# Patient Record
Sex: Female | Born: 1945 | Race: White | Hispanic: No
Health system: Southern US, Community
[De-identification: ages and names within clinical notes are randomized; demographics above are authoritative.]

---

## 1998-01-31 ENCOUNTER — Encounter
Admission: RE | Admit: 1998-01-31 | Discharge: 1998-05-01 | Payer: Self-pay | Admitting: Physical Medicine & Rehabilitation

## 2005-10-07 ENCOUNTER — Ambulatory Visit: Payer: Self-pay | Admitting: Family Medicine

## 2005-12-17 ENCOUNTER — Encounter: Payer: Self-pay | Admitting: Family Medicine

## 2005-12-23 ENCOUNTER — Encounter: Payer: Self-pay | Admitting: Family Medicine

## 2006-01-23 ENCOUNTER — Encounter: Payer: Self-pay | Admitting: Family Medicine

## 2006-05-11 ENCOUNTER — Ambulatory Visit: Payer: Self-pay | Admitting: Unknown Physician Specialty

## 2011-05-19 ENCOUNTER — Ambulatory Visit: Payer: Self-pay | Admitting: Family Medicine

## 2012-05-23 ENCOUNTER — Ambulatory Visit: Payer: Self-pay | Admitting: Family Medicine

## 2012-10-31 ENCOUNTER — Emergency Department: Payer: Self-pay | Admitting: Emergency Medicine

## 2012-10-31 LAB — COMPREHENSIVE METABOLIC PANEL
Albumin: 4.1 g/dL (ref 3.4–5.0)
Anion Gap: 10 (ref 7–16)
Bilirubin,Total: 0.5 mg/dL (ref 0.2–1.0)
Calcium, Total: 9.5 mg/dL (ref 8.5–10.1)
Chloride: 105 mmol/L (ref 98–107)
Co2: 25 mmol/L (ref 21–32)
Creatinine: 0.95 mg/dL (ref 0.60–1.30)
EGFR (African American): >60
Glucose: 101 mg/dL — ABNORMAL HIGH (ref 65–99)
Osmolality: 279 (ref 275–301)
Potassium: 3.4 mmol/L — ABNORMAL LOW (ref 3.5–5.1)

## 2012-10-31 LAB — URINALYSIS, COMPLETE
Bilirubin,UR: NEGATIVE
Blood: NEGATIVE
Glucose,UR: NEGATIVE mg/dL (ref 0–75)
Leukocyte Esterase: NEGATIVE
Nitrite: NEGATIVE
Ph: 6 (ref 4.5–8.0)
Protein: NEGATIVE
Specific Gravity: 1.002 (ref 1.003–1.030)
WBC UR: 1 /HPF (ref 0–5)

## 2012-10-31 LAB — CBC
HGB: 13.6 g/dL (ref 12.0–16.0)
Platelet: 244 10*3/uL (ref 150–440)
RBC: 4.58 10*6/uL (ref 3.80–5.20)
RDW: 13.8 % (ref 11.5–14.5)
WBC: 6 10*3/uL (ref 3.6–11.0)

## 2012-10-31 LAB — LIPASE, BLOOD: Lipase: 105 U/L (ref 73–393)

## 2012-10-31 LAB — CK TOTAL AND CKMB (NOT AT ARMC): CK-MB: 1.6 ng/mL (ref 0.5–3.6)

## 2012-11-02 ENCOUNTER — Inpatient Hospital Stay: Payer: Self-pay | Admitting: Specialist

## 2012-11-02 LAB — CBC
HCT: 38.9 % (ref 35.0–47.0)
HGB: 13.3 g/dL (ref 12.0–16.0)
RBC: 4.57 10*6/uL (ref 3.80–5.20)
RDW: 13.9 % (ref 11.5–14.5)
WBC: 6 10*3/uL (ref 3.6–11.0)

## 2012-11-02 LAB — URINALYSIS, COMPLETE
Bilirubin,UR: NEGATIVE
Nitrite: NEGATIVE
Ph: 6 (ref 4.5–8.0)
Protein: NEGATIVE
RBC,UR: <1 /HPF (ref 0–5)
Squamous Epithelial: NONE SEEN

## 2012-11-02 LAB — COMPREHENSIVE METABOLIC PANEL
Albumin: 3.8 g/dL (ref 3.4–5.0)
Alkaline Phosphatase: 71 U/L (ref 50–136)
Anion Gap: 6 — ABNORMAL LOW (ref 7–16)
BUN: 12 mg/dL (ref 7–18)
Bilirubin,Total: 0.4 mg/dL (ref 0.2–1.0)
Chloride: 105 mmol/L (ref 98–107)
Co2: 28 mmol/L (ref 21–32)
Creatinine: 1.01 mg/dL (ref 0.60–1.30)
EGFR (Non-African Amer.): 58 — ABNORMAL LOW
Potassium: 3.3 mmol/L — ABNORMAL LOW (ref 3.5–5.1)
SGOT(AST): 19 U/L (ref 15–37)
SGPT (ALT): 22 U/L (ref 12–78)

## 2012-11-02 LAB — PROTIME-INR: INR: 1

## 2012-11-03 DIAGNOSIS — I6789 Other cerebrovascular disease: Secondary | ICD-10-CM

## 2012-11-03 LAB — CBC WITH DIFFERENTIAL/PLATELET
Basophil #: 0 10*3/uL (ref 0.0–0.1)
Basophil %: 0.7 %
Eosinophil %: 1.5 %
HGB: 12.8 g/dL (ref 12.0–16.0)
Lymphocyte %: 24 %
MCH: 29.6 pg (ref 26.0–34.0)
MCHC: 34.5 g/dL (ref 32.0–36.0)
MCV: 86 fL (ref 80–100)
Monocyte #: 0.5 x10 3/mm (ref 0.2–0.9)
Neutrophil #: 3.5 10*3/uL (ref 1.4–6.5)
Neutrophil %: 64.6 %
RBC: 4.34 10*6/uL (ref 3.80–5.20)
WBC: 5.4 10*3/uL (ref 3.6–11.0)

## 2012-11-03 LAB — BASIC METABOLIC PANEL
Calcium, Total: 8.9 mg/dL (ref 8.5–10.1)
Co2: 26 mmol/L (ref 21–32)
Creatinine: 0.99 mg/dL (ref 0.60–1.30)
EGFR (Non-African Amer.): 59 — ABNORMAL LOW
Glucose: 99 mg/dL (ref 65–99)
Osmolality: 284 (ref 275–301)
Potassium: 3.9 mmol/L (ref 3.5–5.1)
Sodium: 142 mmol/L (ref 136–145)

## 2012-11-03 LAB — LIPID PANEL
Cholesterol: 147 mg/dL (ref 0–200)
HDL Cholesterol: 37 mg/dL — ABNORMAL LOW (ref 40–60)
Triglycerides: 64 mg/dL (ref 0–200)

## 2012-11-03 LAB — HEMOGLOBIN A1C: Hemoglobin A1C: 5.3 % (ref 4.2–6.3)

## 2012-11-03 LAB — MAGNESIUM: Magnesium: 2 mg/dL

## 2012-11-04 ENCOUNTER — Ambulatory Visit: Payer: Self-pay | Admitting: Neurology

## 2012-11-08 ENCOUNTER — Encounter: Payer: Self-pay | Admitting: Internal Medicine

## 2012-11-25 ENCOUNTER — Encounter: Payer: Self-pay | Admitting: Internal Medicine

## 2013-06-14 ENCOUNTER — Ambulatory Visit: Payer: Self-pay | Admitting: Family Medicine

## 2013-10-01 IMAGING — CT CT HEAD WITHOUT CONTRAST
1 series · 16 of 30 positions shown, 20 images · non-contrast
Comparison: none

REASON FOR EXAM: 4 days left up and low ext weakness
COMMENTS:

[Series 2: soft tissue · axial · 0.42mm/px · z∈[+1178,+1314]mm · 16 of 30 slices shown, 20 images]
[im 2/30  brain]
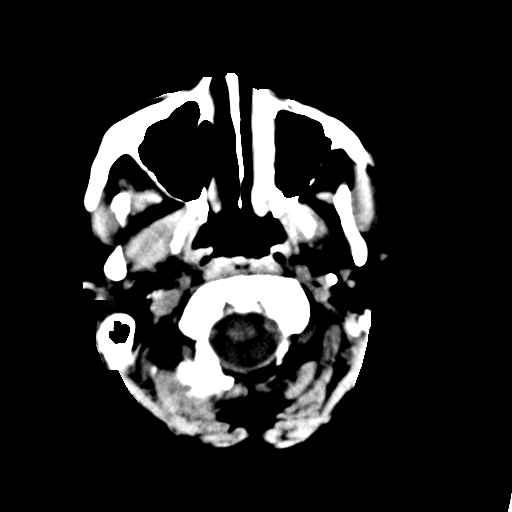
[im 2/30  bone]
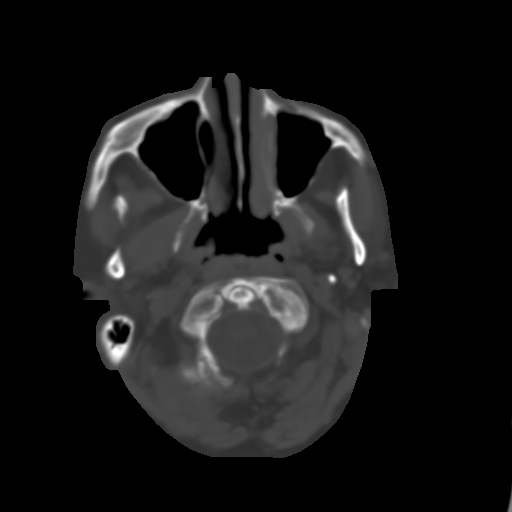
[im 4/30  brain]
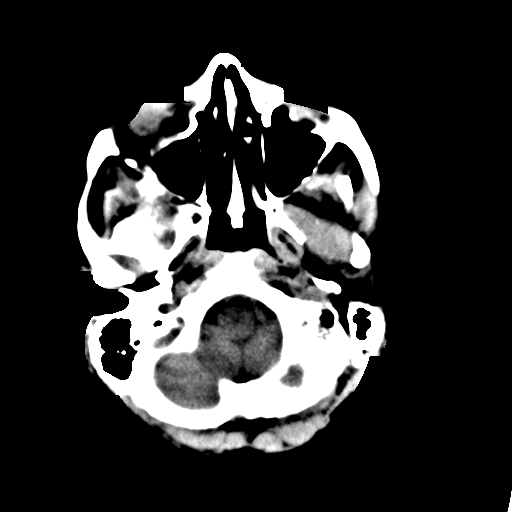
[im 6/30  brain]
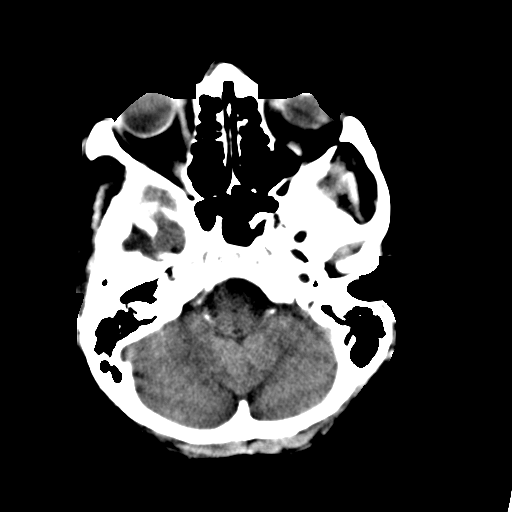
[im 8/30  brain]
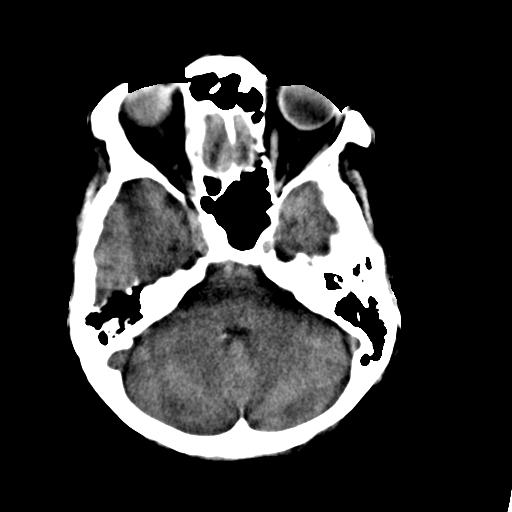
[im 9/30  brain]
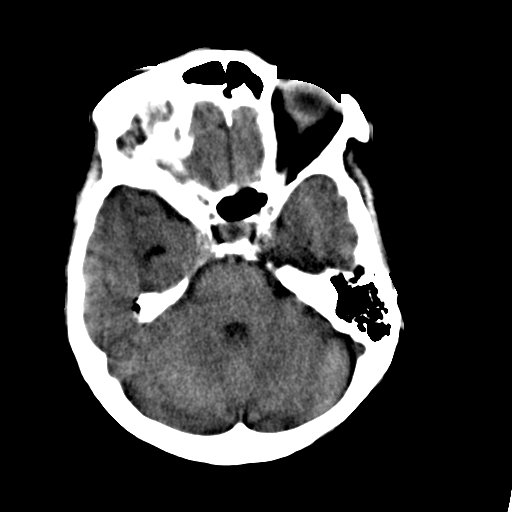
[im 9/30  bone]
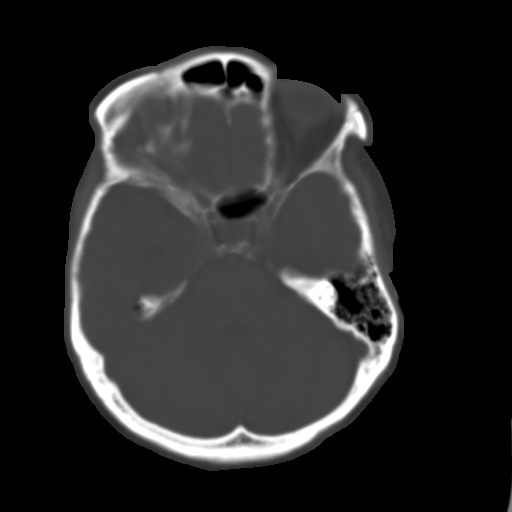
[im 11/30  brain]
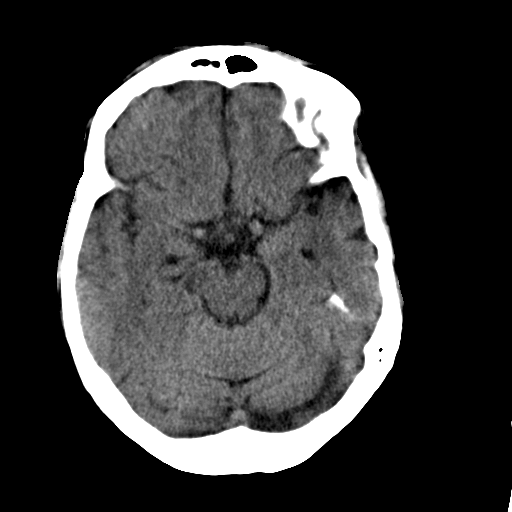
[im 13/30  brain]
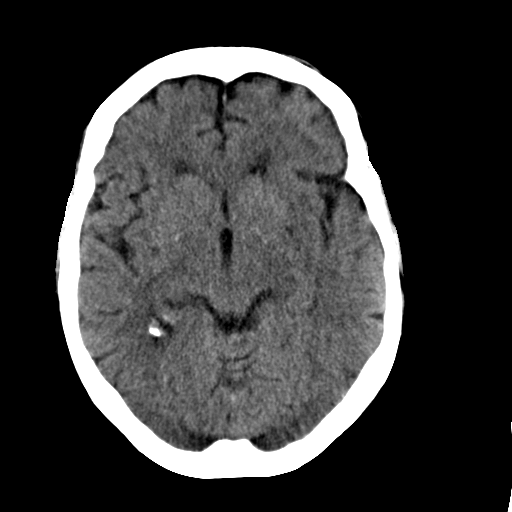
[im 15/30  brain]
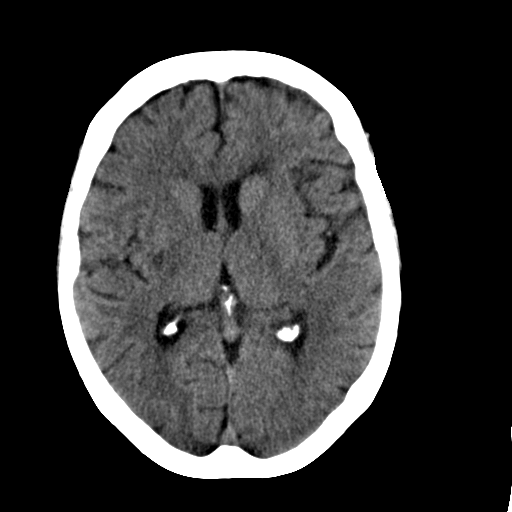
[im 16/30  brain]
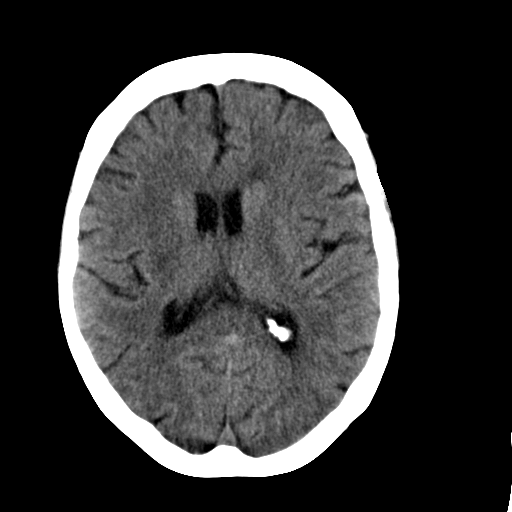
[im 16/30  bone]
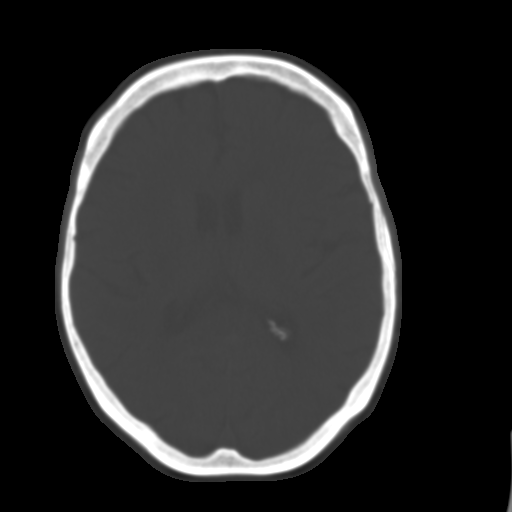
[im 18/30  brain]
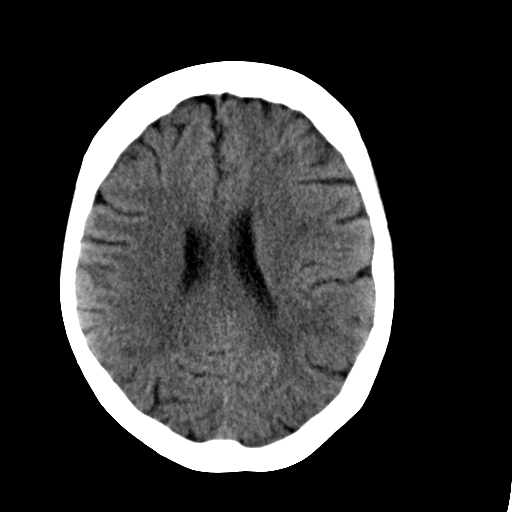
[im 20/30  brain]
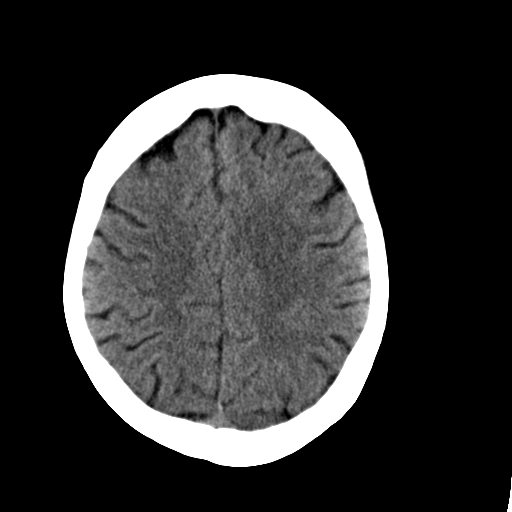
[im 22/30  brain]
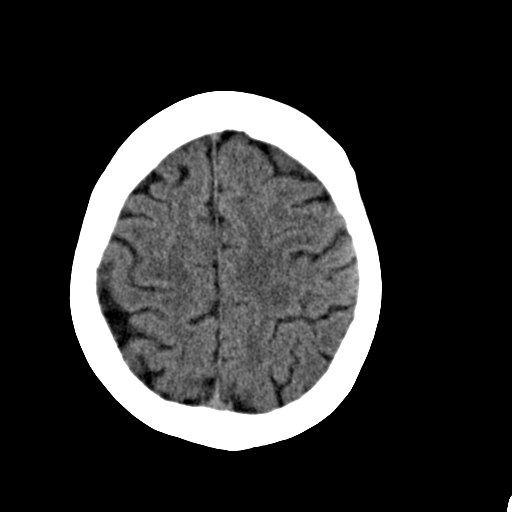
[im 23/30  brain]
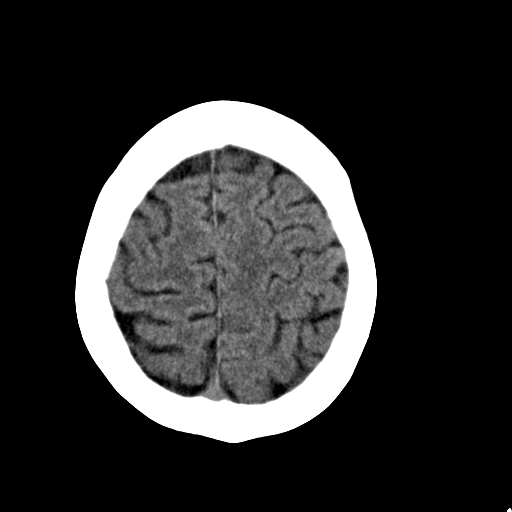
[im 23/30  bone]
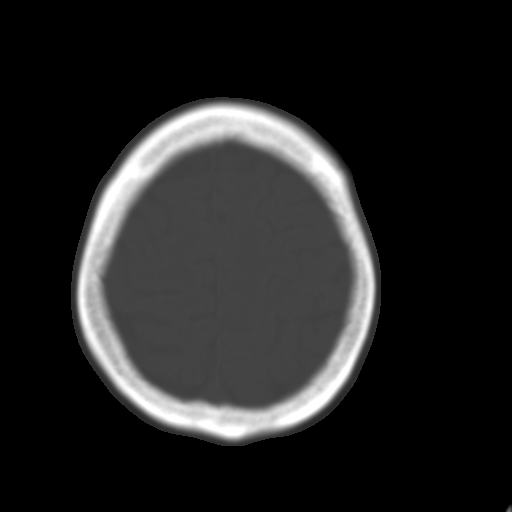
[im 25/30  brain]
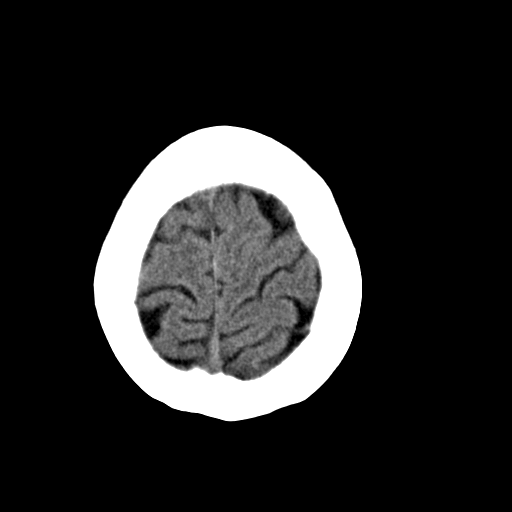
[im 27/30  brain]
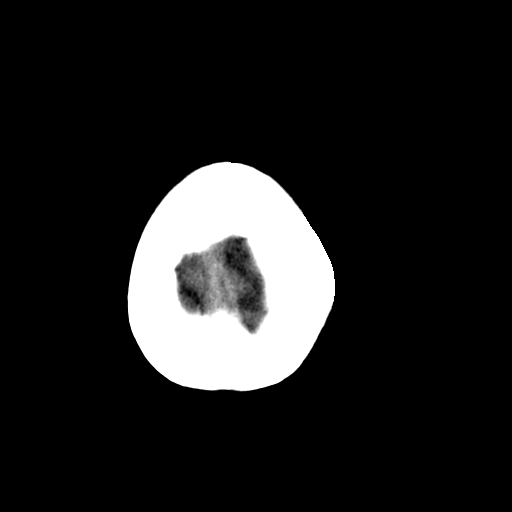
[im 29/30  brain]
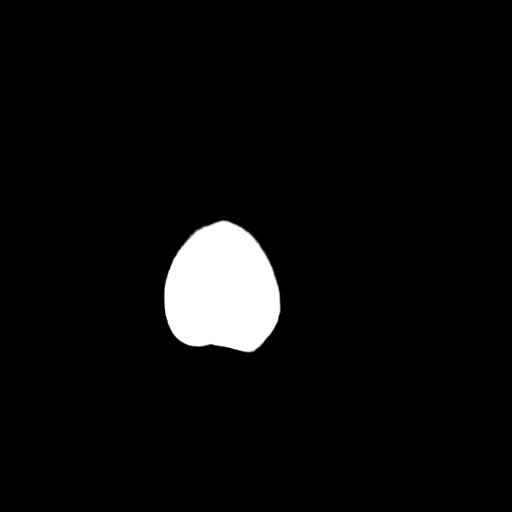

[16 of 30 positions shown; findings below may reference images not displayed]

PROCEDURE:     CT  - CT HEAD WITHOUT CONTRAST  - November 02, 2012  [DATE]

RESULT:     Noncontrast emergent CT of the brain is performed. The patient
has no previous exam for comparison.

There is a lacunar infarct in the lateral or strain capsular region of the
right vasoganglia. There is no intracranial hemorrhage, defined mass, mass
effect or midline shift. No evolving infarct is appreciated. The included
paranasal sinuses and mastoid air cells show normal appearing aeration. No
depressed skull fracture is evident.
IMPRESSION: Old right basal ganglia lacunar infarct. No acute
intracranial abnormality.

[REDACTED]

## 2014-06-25 ENCOUNTER — Ambulatory Visit: Payer: Self-pay | Admitting: Family Medicine

## 2014-12-03 DIAGNOSIS — L659 Nonscarring hair loss, unspecified: Secondary | ICD-10-CM | POA: Diagnosis not present

## 2014-12-03 DIAGNOSIS — Z6824 Body mass index (BMI) 24.0-24.9, adult: Secondary | ICD-10-CM | POA: Diagnosis not present

## 2014-12-03 DIAGNOSIS — L219 Seborrheic dermatitis, unspecified: Secondary | ICD-10-CM | POA: Diagnosis not present

## 2014-12-03 DIAGNOSIS — Z79899 Other long term (current) drug therapy: Secondary | ICD-10-CM | POA: Diagnosis not present

## 2014-12-03 DIAGNOSIS — B351 Tinea unguium: Secondary | ICD-10-CM | POA: Diagnosis not present

## 2015-02-14 NOTE — Discharge Summary (Signed)
PATIENT NAME:  Robin Holt, [name] MR#:  [id number] DATE OF BIRTH:  1946/02/27  DATE OF ADMISSION:  11/02/2012 DATE OF DISCHARGE:  11/07/2012  For a detailed note, please see the history and physical done on admission by Dr. Krystal EatonShayiq Ahmadzia.  DISCHARGE DIAGNOSES: 1. 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 2. Hypertension. 3. Gastroesophageal reflux disease.  DIET: The patient is being discharged on a low sodium, low fat diet.   ACTIVITY: As tolerated.  DISCHARGE FOLLOW-UP:  Follow-up is with her primary care physician in the next 1 to 2 weeks. Also follow up with the physician at the skilled nursing facility.   DISCHARGE MEDICATIONS: 1. Aggrenox 1 tab 2 times a day. 2. Prilosec 20 mg daily.  3. Calcium with vitamin D 1 tab 3 times daily. 4. Zofran 4 mg 3 times daily as needed. 5. Amlodipine 2.5 mg daily. 6. Simvastatin 20 mg daily.   CONSULTANTS: Pauletta BrownsYuriy Zeylikman, MD - Neurology.  PERTINENT STUDIES DURING HOSPITAL COURSE: CT scan of the head done without contrast on admission showed old right basal ganglia lacunar infarct. No acute intracranial abnormality.   Ultrasound of the carotids showed no evidence of any hemodynamically significant carotid artery stenosis.   Two-dimensional echocardiogram showed left ventricular systolic function to be normal with ejection fraction 55%. No evidence of any thrombus.   MRI of the brain done without contrast showed acute nonhemorrhagic right putaminal infarct.   HOSPITAL COURSE: This is a 69 year old female with medical problems as mentioned above who presented to the hospital with weakness on the left side and some ataxic gait.  1. Acute CVA. The patient presented to the hospital with left-sided weakness and also having some problems with coordination, on the left side. There was initial concern for a possible CVA. The patient was started on some Aggrenox. She underwent extensive testing  including a CT of the head, which was initially negative, but MRI confirmed a right-sided putamen stroke. Her carotid ultrasound was negative. Her two-dimensional echocardiogram did not show any thrombus. She was not noted to be in any acute atrial fibrillation. The patient still continues to have persistent left-sided coordination problem and ataxic gait, although she is being managed medically with Aggrenox and a statin for now. She was seen by physical therapy who initially thought she would benefit from acute inpatient rehab, although she has improved since then and does not qualify for it. Therefore, she presently is being discharged to a short-term rehab for ongoing physical therapy post CVA. The patient presently denies any headache and she has had no further other neurological symptoms since being admitted to the hospital.  2. Hypertension. The patient has remained fairly hemodynamically stable in the hospital. We allowed for some permissive hypertension given her acute CVA. She presently is being discharged on low dose Norvasc, as stated.  3. GERD. The patient was maintained on her omeprazole. She will resume that.  4. Osteoporosis. The patient will continue with her calcium and vitamin D supplements.   CODE STATUS: The patient is FULL CODE.   TIME SPENT ON DISCHARGE: 40 minutes.  ____________________________ Rolly PancakeVivek J. Cherlynn KaiserSainani, MD vjs:sb D: 11/07/2012 10:49:33 ET T: 11/07/2012 10:59:53 ET JOB#: [id number]  cc: Rolly PancakeVivek J. Cherlynn KaiserSainani, MD, <Dictator> Houston SirenVIVEK J SAINANI MD ELECTRONICALLY SIGNED 11/07/2012 15:34

## 2015-02-14 NOTE — H&P (Signed)
PATIENT NAME:  Robin Holt, Robin Holt MR#:  [id number] DATE OF BIRTH:  [birth year]  DATE OF ADMISSION:  11/02/2012  REFERRING PHYSICIAN: Si Raiderhristine Braud, MD  CHIEF COMPLAINT: Weakness on the left side.   HISTORY OF PRESENT ILLNESS: The patient is a pleasant 69 year old Caucasian female with history of GERD who presents with the above chief complaint. The patient stated that about two weeks ago she started to have a bout of nausea, vomiting and diarrhea which she describes as severe and all day. There was no blood in the stool and it resolved after a day or so. The symptoms went away for a week and came back last Friday and again the patient had similar symptoms. She had no recent sick contacts or antibiotic use. Again, diarrhea was nonbloody and the symptoms improved. Later during the night she started to develop right-sided neck pain and it went away after some time. She started to have left-sided weakness about 4 to 5 days ago, in the upper and lower extremities. The patient presented to the ER two days ago, on the 7th. At that time, she was evaluated and discharged from the ER. She presented back with persistent symptoms. She had a fall last night because her left foot "did not work".  Here she has had a CT of the head which is negative for acute stroke, but it does show old right basal ganglia lacunar infarct. Hospitalist services were contacted for further evaluation and management.   PAST MEDICAL HISTORY: GERD.  PAST SURGICAL HISTORY: Hysterectomy.   ALLERGIES: SHE STATES SHE IS ALLERGIC TO ALL OLD ANTIBIOTICS, ALSO SUDAFED AND PHENYLEPHRINE HYDROCHLORIDE.   OUTPATIENT MEDICATIONS: 1. Calcium plus D 600/200 international units 1 tab three times a day.  2. Prilosec 20 mg daily.  3. Zofran disintegrating ODT 4 mg 3 times a day as needed for nausea, just prescribed a couple of days ago. 4. The patient has also been taking over-the-counter [employer] brand multipurpose cold and flu pills, about 16 tabs, which  did contain Sudafed, which the patient states she is allergic to and it drove her blood pressure up.   FAMILY HISTORY: Mom with hypertension.   SOCIAL HISTORY: No tobacco, alcohol or drug use. Lives by herself.   REVIEW OF SYSTEMS:    CONSTITUTIONAL: No fever but fatigue. No weight changes. Positive for weakness as above.   EYES: No blurry vision, double vision or redness.   ENT: No tinnitus, hearing loss or snoring,   RESPIRATORY: No cough, wheezing, hemoptysis, dyspnea or shortness of breath. No history of high blood pressure.   CARDIOVASCULAR: No chest pain or palpitations.   GASTROINTESTINAL: Has some nausea currently. No vomiting or diarrhea. No abdominal pain or rectal bleeding.   GENITOURINARY: Denies dysuria and hematuria.  HEME/LYMPH: No anemia or easy bruising.   SKIN: No new rashes.   MUSCULOSKELETAL: No muscle or joint pains.  NEUROLOGIC: No numbness, no weakness on the right side and no history of stroke or TIA.   PSYCHIATRIC: No anxiety or insomnia.   PHYSICAL EXAMINATION:  VITAL SIGNS: Temperature on arrival was noted to be 98.6, pulse 76, respiratory rate 22, blood pressure 166/82 and O2 saturation was 99% on room air.   GENERAL: The patient is an elderly Caucasian female lying in bed in no obvious distress talking in full sentences.   HEENT: Normocephalic, atraumatic. Pupils are equal and reactive. Anicteric sclerae. Dry mucous membranes. Extraocular muscles intact.   NECK: Supple. No thyroid tenderness. No cervical lymphadenopathy.   CARDIOVASCULAR: S1  and S2 regular rate and rhythm. Positive for systolic ejection murmur in aortic region, III/VI.  LUNGS: Clear to auscultation without wheezing or rhonchi.   ABDOMEN: Soft and nontender. Hyperactive. No rebound or guarding.   EXTREMITIES: No significant lower extremity edema.   SKIN: No obvious rashes.   NEUROLOGIC: Cranial nerves II through XII are grossly intact. Strength is 5 out of 5 on the right  side, 4 out of 5 in the right upper and lower extremity. 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 I did not test balance or ambulation.   PSYCH: Awake, alert and oriented x 3, pleasant and cooperative.   LABS/RADIOLOGIC STUDIES: Glucose 107, BUN 12, creatinine 1.01, and potassium 3.3. Magnesium is 1.7. LFTs are within normal limits. WBC is 6, hemoglobin 13.3 and platelets 231.   Urinalysis is not suggestive of infection.   EKG: Appears to be sinus rhythm, rate 60. No acute ST elevations or depressions.   CAT scan of the head as above.  X-ray, PA and lateral, of the chest, is showing deformity of the lateral aspect of the right 8th and 9th rib. No pneumothorax or pleural effusion. Mildly hyperinflated.   ASSESSMENT AND PLAN: We have a pleasant 69 year old Caucasian female with GERD who has been having nausea, vomiting and diarrhea on a couple of occasions now improved with weakness on the left side with some physical exam signs concerning for a stroke, possibly in cerebellar region. 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 The patient does have some weakness on the left side as well. At this point, we will admit the patient to the hospital with stroke pathway and start the patient on Aggrenox. There is no acute stroke on CAT scan of the head. We would admit he patient to telemetry and monitor her rhythm for any significant arrhythmias. We would obtain a MRI and carotid ultrasound to look for any significant stenosis and an echocardiogram to look for CVA source. We would obtain a PT consult as well and start the patient on a statin and do frequent neuro checks and allow for permissive hypertension at this point. We would treat her nausea with Zofran. We would continue her PPI at this point. We would start her on heparin for DVT  prophylaxis. The patient is FULL CODE.  TOTAL TIME SPENT: 50 minutes. ____________________________ Krystal Eaton, MD sa:sb D: 11/02/2012 12:32:58 ET T: 11/02/2012 12:45:54 ET JOB#: [id number]  cc: Krystal Eaton, MD, <Dictator> Krystal Eaton MD ELECTRONICALLY SIGNED 11/13/2012 20:35

## 2015-02-14 NOTE — Consult Note (Signed)
PATIENT NAME:  Robin Holt, Christena R MR#:  [id number] DATE OF BIRTH:  1946-09-03  DATE OF CONSULTATION:  11/04/2012  REFERRING PHYSICIAN:   CONSULTING PHYSICIAN:  Pauletta BrownsYuriy Toniyah Dilmore, MD  REASON FOR FOLLOWUP CONSULTATION:  Left-sided weakness and headache.   HISTORY OF PRESENT ILLNESS:  This is a 69 year old female presenting on Wednesday on 11/01/2012 with left-sided weakness.  On MRI of the brain, she was found to have a right-sided nonhemorrhagic right putaminal infarct.  She had ultrasound, carotid Dopplers were performed that showed minimal atherosclerotic disease.  No evidence of hemodynamic stenosis.    PAST MEDICAL HISTORY:  Have been reviewed.    MEDICATIONS:  Have been reviewed.  The patient has been complaining of new onset occipital headache lasting about 2 to 3 days post  admission; she was started on Aggrenox twice a day.  Currently states headache is for the most part relieved.   PHYSICAL EXAMINATION:   VITAL SIGNS:  Her temperature is 98.5, blood pressure 174/84, pulse oximetry 96.  GENERAL:  Significant for she is a Caucasian female sitting in a hospital chair, does not appear to be agitated.  CARDIAC:  S1, S2 sounds, no bruits heard.  LUNGS:  Clear to auscultation. NEUROLOGIC:  Her speech was intact.  Able to tell me month, date and president of [place].  Immediate and delayed recall is intact.   CRANIAL NERVE EXAMINATION:  Pupils equal, round, and reactive.  Face is symmetrical.  Tongue is midline.  Palate elevates symmetrically.  Facial sensation appeared to be intact.   MOTOR EXAMINATION:  Significant for she was found to have a left pronator drift on the left upper extremity and right is 5/5, and there was decreased tone on the left side.  Left lower extremity appears to be 4/5 which is slight improvement since admission.  There is decreased sensation to light touch in the left upper and left lower extremity.  Reflexes were symmetrical.   IMPRESSION:  This is a 69 year old  Caucasian female found to have right putaminal ischemic infarct.  Started her on Aggrenox.  Reason for followup is because of the headache suspected to be induced by Aggrenox.  Since the headache has significantly improved, we will continue Aggrenox at the same dose twice a day.       ____________________________ Pauletta BrownsYuriy Naveen Lorusso, MD yz:ea D: 11/04/2012 14:05:08 ET T: 11/05/2012 04:44:45 ET JOB#: [id number]  cc: Pauletta BrownsYuriy Yoona Ishii, MD, <Dictator> Pauletta BrownsYURIY Thandiwe Siragusa MD ELECTRONICALLY SIGNED 11/19/2012 13:41

## 2015-02-14 NOTE — Consult Note (Signed)
PATIENT NAME:  Robin Holt, COAXUM MR#:  [id number] DATE OF BIRTH:  12/09/1945  DATE OF CONSULTATION:  11/03/2012  REFERRING PHYSICIAN: Dr. Demetrius Revel CONSULTING PHYSICIAN:  Hemang K. Sherryll Burger, MD  REASON FOR CONSULTATION:  Left-sided weakness.   HISTORY OF PRESENT ILLNESS:  The patient is a 69 year old Caucasian female who had an acute onset of left-sided weakness on Wednesday, 11/01/2012 that she noticed in the morning.  She felt like she was not able to move her left arm and leg really well.  She also had some numbness in the left face, arm and leg.   She has not noted any significant change since then.  She denied any similar history of similar symptoms in the past.  She denied any other TIA-like symptoms.   She does not have family history of a stroke.  She has been taking aspirin 81 mg for the last year or so.   The patient had a recent history of nausea, vomiting and diarrhea lasting for around 5 or 6 days which happened around two weeks ago.  The patient did not feel like she had any viral prodrome at that time, but then she had a "flu" and she had taken antibiotic plus decongestant with the Sudafed in it.   PAST MEDICAL HISTORY:  Significant for GERD.   PAST SURGICAL HISTORY:  Significant for hysterectomy.   ALLERGIES:  I reviewed her allergies.  MEDICATIONS:  I reviewed her home medication list.  FAMILY HISTORY:  Significant that mom had hypertension.   SOCIAL HISTORY:  Significant that she lives in [place].  She does not use tobacco, alcohol or drugs.   REVIEW OF SYSTEMS:  Positive for left-sided weakness, recent neck pain which has completely resolved.  Recent history of some nausea, vomiting, diarrhea, which has resolved.    She does not have any other focal neurological deficit.  Other 10 system review of system was asked and was found to be negative.   PHYSICAL EXAMINATION: VITAL SIGNS:  Her temperature was 97.8, pulse 67, respiratory rate 18, blood pressure 162/70, pulse  oximetry 98%.  GENERAL:  She is an elderly-looking Caucasian female sitting in a hospital chair accompanied by her pastor.  She is in a good mood.  She was wearing glasses.  LUNGS:  Clear to auscultation.  HEART:  S1, S2 heart sounds.  Carotid exam did not reveal any bruit.  NEUROLOGIC:  She was alert.  She did not have neurological neglect except possible tactile neglect.   Her speech was intact.  She was able to count months of the year backward.  She knew the current day, date, month, the year.   She knew the current President Obama.  Could not tell me the previous president Bush.   The patient's immediate recall was III/III and delayed recall was III/III as well.     I did not feel like she had any language deficit or she had any neglect other than the tactile neglect questionably.    CRANIAL NERVES:  Her pupils are equal, round and reactive.  Extraocular movements are intact.  Her face is symmetrical.  Tongue was midline.  Facial sensations were intact.   Except she felt like the left side of the face was not quite right.   MOTOR:  Her tone and strength was V/V in her right upper and lower extremity.   She had positive pronator drift in her left upper extremity.  She had a mildly decreased tone in the left side.  She had good  grip strength, but she had very poor dexterity.  She seemed to have difficulty with the motor planning.   She also had a similar issue with her left lower extremity that she could not tap my hand really fast.   I did not feel like it was more ataxia related.  I feel like it was more corticospinal tract injury type of a weakness.   SENSORY:  Showed decreased sensation to light touch in her left arm and leg.   Reflexes seems to be symmetric.  I did not check her gait.   LABORATORY, DIAGNOSTIC AND RADIOLOGICAL DATA:  On her MRI of the brain, she does have an area of DWI positivity and ADC negativity on the right posterior limb internal capsule putamen region,  extending up to coronary radiata.    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   I suspect this is a small vessel disease, but cannot rule out intracranial stenosis.    As the patient was taking aspirin in the past, I agree with switching over to Aggrenox.  But patient had a significant headache, so we can slow down the titration of Aggrenox to once a day for a week and then go to twice a day.   If patient cannot tolerate Aggrenox, we can switch her over to Plavix without aspirin.   The patient should be on a statin.    We should avoid any drastic lowering of her blood pressure which can increase her size of infarct (that can convert penumbra into the ischemic area).   The patient should get a flu vaccine, multivitamin.  Patient should get aggressive physical therapy, occupational therapy.  Her speech seems to be okay.  She I think passed the swallow evaluation.   I talked to the patient about mirror therapy for avoiding learned motor inhibition.   The patient should get DVT prophylaxis.  The patient lives alone at home.  She might need inpatient rehab depending upon her physical therapy evaluation.   Feel free to contact me with any further questions.  I am not on call this weekend.  [facility] neurologist will take over.   Feel free to contact me with any further questions.       ____________________________ Durene CalHemang K. Sherryll BurgerShah, MD hks:ea D: 11/03/2012 19:10:38 ET T: 11/04/2012 01:11:40 ET JOB#: [id number]  cc: Hemang K. Sherryll BurgerShah, MD, <Dictator> Maura L. Hamrick, MD Durene CalHEMANG K [facility] LL[facility] MD ELECTRONICALLY SIGNED 12/20/2012 7:27

## 2015-06-13 ENCOUNTER — Other Ambulatory Visit: Payer: Self-pay | Admitting: Family Medicine

## 2015-06-13 DIAGNOSIS — Z1231 Encounter for screening mammogram for malignant neoplasm of breast: Secondary | ICD-10-CM
